# Patient Record
Sex: Female | Born: 1975 | Race: White | Hispanic: No
Health system: Southern US, Academic
[De-identification: ages and names within clinical notes are randomized; demographics above are authoritative.]

## PROBLEM LIST (undated history)

## (undated) DIAGNOSIS — I319 Disease of pericardium, unspecified: Secondary | ICD-10-CM

## (undated) HISTORY — PX: WRIST SURGERY: SHX841

## (undated) HISTORY — PX: HERNIA REPAIR: SHX51

---

## 2016-06-21 ENCOUNTER — Emergency Department: Payer: BLUE CROSS/BLUE SHIELD

## 2016-06-21 ENCOUNTER — Encounter: Payer: Self-pay | Admitting: Emergency Medicine

## 2016-06-21 DIAGNOSIS — R0789 Other chest pain: Secondary | ICD-10-CM | POA: Diagnosis not present

## 2016-06-21 DIAGNOSIS — R42 Dizziness and giddiness: Secondary | ICD-10-CM | POA: Diagnosis present

## 2016-06-21 DIAGNOSIS — R202 Paresthesia of skin: Secondary | ICD-10-CM | POA: Diagnosis not present

## 2016-06-21 LAB — CBC
HEMATOCRIT: 39.6 % (ref 35.0–47.0)
Hemoglobin: 13.5 g/dL (ref 12.0–16.0)
MCH: 27.6 pg (ref 26.0–34.0)
MCHC: 34 g/dL (ref 32.0–36.0)
MCV: 81.1 fL (ref 80.0–100.0)
Platelets: 217 10*3/uL (ref 150–440)
RBC: 4.88 MIL/uL (ref 3.80–5.20)
RDW: 13.1 % (ref 11.5–14.5)
WBC: 12.6 10*3/uL — AB (ref 3.6–11.0)

## 2016-06-21 LAB — BASIC METABOLIC PANEL
Anion gap: 9 (ref 5–15)
BUN: 14 mg/dL (ref 6–20)
CHLORIDE: 106 mmol/L (ref 101–111)
CO2: 23 mmol/L (ref 22–32)
Calcium: 8.9 mg/dL (ref 8.9–10.3)
Creatinine, Ser: 0.51 mg/dL (ref 0.44–1.00)
GFR calc Af Amer: >60 mL/min (ref >60)
GFR calc non Af Amer: >60 mL/min (ref >60)
GLUCOSE: 105 mg/dL — AB (ref 65–99)
POTASSIUM: 3.5 mmol/L (ref 3.5–5.1)
SODIUM: 138 mmol/L (ref 135–145)

## 2016-06-21 LAB — TROPONIN I: Troponin I: <0.03 ng/mL (ref <0.03)

## 2016-06-21 NOTE — ED Triage Notes (Signed)
Pt says last night she felt "off"; today c/o pain to the center of her chest; "pings"; tingling to the left side of her neck, down her left arm and left leg; pt says her mom passed away Friday and has been under a lot of stress; wasn't overly concerned when she was just having chest pain, but the tingling to the left side of her body has her concerned;

## 2016-06-22 ENCOUNTER — Emergency Department
Admission: EM | Admit: 2016-06-22 | Discharge: 2016-06-22 | Disposition: A | Discharge status: Home / Self Care | Payer: BLUE CROSS/BLUE SHIELD | Attending: Emergency Medicine | Admitting: Emergency Medicine

## 2016-06-22 DIAGNOSIS — R202 Paresthesia of skin: Secondary | ICD-10-CM

## 2016-06-22 DIAGNOSIS — R0789 Other chest pain: Secondary | ICD-10-CM

## 2016-06-22 HISTORY — DX: Disease of pericardium, unspecified: I31.9

## 2016-06-22 NOTE — ED Notes (Signed)
Pt states that she is in town and under a lot of stress right now because her mother just passed away yesterday. Pt states that she has been having some "twinges" in her chest and some numbness and tingling in the left side of her neck, left arm, and left leg, pt states that the tingling in her arm and leg have been going on for the past few weeks and states that she has been treated for a pinched nerve in her neck for the past few weeks as well. Pt states that she grew concerned when she started feeling the twinge in the left side of her neck and thought maybe she was having a heart attack. No distress noted at this time, pt tearful and asking for the testing to be expedited due to her mother's service in the morning

## 2016-06-22 NOTE — ED Provider Notes (Signed)
[facility] Emergency Department Provider Note  ____________________________________________  Time seen: Approximately 1:47 AM  I have reviewed the triage vital signs and the nursing notes.   HISTORY  Chief Complaint Chest Pain; Tingling; and Dizziness    HPI Melanie West is a 40 y.o. female who complains of tingling of her left hand left leg and left side of the neck for the past 24 hours. Gradual onset, constant. No headaches vision changes or trauma. Also has been having brief fleeting "pings" of pain in the chest and left neck as well. She does note that her mother died 2 days ago just prior to the onset of the symptoms. [place] just [place] here from [place] to be with her family and attend to service tomorrow. Denies any chest pain shortness of breath or abdominal pain. No medical history. No significant prior surgeries. No history of early CAD or strokes in her family.  Chest pain not exertional nor pleuritic.  No vomiting, sob, diaphoresis, or radiation.    Past Medical History:  Diagnosis Date  . Pericarditis      There are no active problems to display for this patient.    Past Surgical History:  Procedure Laterality Date  . CESAREAN SECTION    . HERNIA REPAIR    . WRIST SURGERY Right      Prior to Admission medications   Not on File     Allergies Review of patient's allergies indicates no known allergies.   History reviewed. No pertinent family history.  Social History Social History  Substance Use Topics  . Smoking status: Never Smoker  . Smokeless tobacco: Never Used  . Alcohol use No    Review of Systems  Constitutional:   No fever or chills. Anxious ENT:   No sore throat. No rhinorrhea. Cardiovascular:   Positive fleeting chest pain. Respiratory:   No dyspnea or cough. Gastrointestinal:   Negative for abdominal pain, vomiting and diarrhea.   Musculoskeletal:   Negative for focal pain or swelling Neurological:    Negative for headaches. Positive for left-sided paresthesias 10-point ROS otherwise negative.  ____________________________________________   PHYSICAL EXAM:  VITAL SIGNS: ED Triage Vitals  Enc Vitals Group     BP 06/21/16 2307 122/86     Pulse Rate 06/21/16 2307 73     Resp 06/21/16 2307 18     Temp 06/21/16 2307 98.6 F (37 C)     Temp Source 06/21/16 2307 Oral     SpO2 06/21/16 2307 95 %     Weight 06/21/16 2305 155 lb (70.3 kg)     Height 06/21/16 2305 5\' 6"  (1.676 m)     Head Circumference --      Peak Flow --      Pain Score 06/21/16 2305 4     Pain Loc --      Pain Edu? --      Excl. in GC? --     Vital signs reviewed, nursing assessments reviewed.   Constitutional:   Alert and oriented. Well appearing and in no distress.Anxious appearing and emotionally upset Eyes:   No scleral icterus. No conjunctival pallor. PERRL. EOMI.  No nystagmus. ENT   Head:   Normocephalic and atraumatic.   Nose:   No congestion/rhinnorhea. No septal hematoma   Mouth/Throat:   MMM, no pharyngeal erythema. No peritonsillar mass.    Neck:   No stridor. No SubQ emphysema. No meningismus. Hematological/Lymphatic/Immunilogical:   No cervical lymphadenopathy. Cardiovascular:   RRR. Symmetric bilateral  radial and DP pulses.  No murmurs.  Respiratory:   Normal respiratory effort without tachypnea nor retractions. Breath sounds are clear and equal bilaterally. No wheezes/rales/rhonchi. Gastrointestinal:   Soft and nontender. Non distended. There is no CVA tenderness.  No rebound, rigidity, or guarding. Genitourinary:   deferred Musculoskeletal:   Nontender with normal range of motion in all extremities. No joint effusions.  No lower extremity tenderness.  No edema. Neurologic:   Normal speech and language.  CN 2-10 normal. Normal gait, no pronator drift. Motor grossly intact. No gross focal neurologic deficits are appreciated.  Skin:    Skin is warm, dry and intact. No rash noted.   No petechiae, purpura, or bullae.  ____________________________________________    LABS (pertinent positives/negatives) (all labs ordered are listed, but only abnormal results are displayed) Labs Reviewed  BASIC METABOLIC PANEL - Abnormal; Notable for the following:       Result Value   Glucose, Bld 105 (*)    All other components within normal limits  CBC - Abnormal; Notable for the following:    WBC 12.6 (*)    All other components within normal limits  TROPONIN I   ____________________________________________   EKG  Interpreted by me Sinus rhythm rate of 79, normal axis intervals QRS ST segments and T waves  ____________________________________________    RADIOLOGY  CT head unremarkable Chest x-ray unremarkable  ____________________________________________   PROCEDURES Procedures  ____________________________________________   INITIAL IMPRESSION / ASSESSMENT AND PLAN / ED COURSE  Pertinent labs & imaging results that were available during my care of the patient were reviewed by me and considered in my medical decision making (see chart for details).  Patient well appearing no acute distress, presents with various paresthesias and fleeting anterior chest pain which is atypical and nonspecific, in the setting of grieving for her mother who has very recently died and the same timeframe as he symptoms started.Considering the patient's symptoms, medical history, and physical examination today, I have low suspicion for ACS, PE, TAD, pneumothorax, carditis, mediastinitis, pneumonia, CHF, or sepsis. I have low suspicion for ischemic stroke, intracranial hemorrhage, meningitis, encephalitis, carotid or vertebral dissection, venous sinus thrombosis, MS, intracranial hypertension, glaucoma, CRAO, CRVO, or temporal arteritis.  Patient will follow-up with her primary care doctor when she returns home to [place] in about a week.    Clinical Course    ____________________________________________   FINAL CLINICAL IMPRESSION(S) / ED DIAGNOSES  Final diagnoses:  Paresthesia       Portions of this note were generated with dragon dictation software. Dictation errors may occur despite best attempts at proofreading.    Sharman Cheek, MD 06/22/16 [phone]

## 2016-06-24 ENCOUNTER — Emergency Department: Payer: BLUE CROSS/BLUE SHIELD

## 2016-06-24 ENCOUNTER — Emergency Department
Admission: EM | Admit: 2016-06-24 | Discharge: 2016-06-24 | Discharge status: Against Medical Advice | Payer: BLUE CROSS/BLUE SHIELD | Attending: Emergency Medicine | Admitting: Emergency Medicine

## 2016-06-24 ENCOUNTER — Encounter: Payer: Self-pay | Admitting: Intensive Care

## 2016-06-24 DIAGNOSIS — R202 Paresthesia of skin: Secondary | ICD-10-CM

## 2016-06-24 DIAGNOSIS — F419 Anxiety disorder, unspecified: Secondary | ICD-10-CM | POA: Diagnosis not present

## 2016-06-24 LAB — BASIC METABOLIC PANEL
Anion gap: 8 (ref 5–15)
BUN: 12 mg/dL (ref 6–20)
CALCIUM: 9.3 mg/dL (ref 8.9–10.3)
CO2: 25 mmol/L (ref 22–32)
CREATININE: 0.66 mg/dL (ref 0.44–1.00)
Chloride: 107 mmol/L (ref 101–111)
GFR calc Af Amer: >60 mL/min (ref >60)
GFR calc non Af Amer: >60 mL/min (ref >60)
GLUCOSE: 120 mg/dL — AB (ref 65–99)
Potassium: 3.6 mmol/L (ref 3.5–5.1)
Sodium: 140 mmol/L (ref 135–145)

## 2016-06-24 LAB — CBC
HCT: 40.6 % (ref 35.0–47.0)
HEMOGLOBIN: 13.7 g/dL (ref 12.0–16.0)
MCH: 27.5 pg (ref 26.0–34.0)
MCHC: 33.9 g/dL (ref 32.0–36.0)
MCV: 81.1 fL (ref 80.0–100.0)
Platelets: 237 10*3/uL (ref 150–440)
RBC: 5 MIL/uL (ref 3.80–5.20)
RDW: 13.3 % (ref 11.5–14.5)
WBC: 10 10*3/uL (ref 3.6–11.0)

## 2016-06-24 LAB — TROPONIN I: Troponin I: <0.03 ng/mL (ref <0.03)

## 2016-06-24 MED ORDER — IOPAMIDOL (ISOVUE-370) INJECTION 76%
75.0000 mL | Freq: Once | INTRAVENOUS | Status: AC | PRN
  Administered 2016-06-24: 75 mL via INTRAVENOUS

## 2016-06-24 NOTE — ED Provider Notes (Signed)
[facility] Emergency Department Provider Note    ____________________________________________   I have reviewed the triage vital signs and the nursing notes.   HISTORY  Chief Complaint Numbness (left side facial numbness and tingling) and Anxiety   History limited by: Not Limited   HPI` Melanie West is a 40 y.o. female who presents to the emergency department today because of concerns for continued tingling to the left side of her body, left face and left neck pain. The patient was seen in the emergency department 2 days ago for these symptoms. At that time a CT scan of her head was done and blood work neither of which showed any obvious etiology. The patient is under a lot of stress having come to [place] for her mother's funeral. The mother services tonight. The patient states that the tingling and numbness has gotten worse. She also states that she's been having worsening left-sided neck pressure.   Past Medical History:  Diagnosis Date  . Pericarditis     There are no active problems to display for this patient.   Past Surgical History:  Procedure Laterality Date  . CESAREAN SECTION    . HERNIA REPAIR    . WRIST SURGERY Right     Prior to Admission medications   Not on File    Allergies Review of patient's allergies indicates no known allergies.  History reviewed. No pertinent family history.  Social History Social History  Substance Use Topics  . Smoking status: Never Smoker  . Smokeless tobacco: Never Used  . Alcohol use No    Review of Systems  Constitutional: Negative for fever. Cardiovascular: Negative for chest pain. Respiratory: Negative for shortness of breath. Gastrointestinal: Negative for abdominal pain, vomiting and diarrhea. Genitourinary: Negative for dysuria. Musculoskeletal: Negative for back pain. Skin: Negative for rash. Neurological: Positive for tingling to the left side of her body. Left side of the  face.  10-point ROS otherwise negative.  ____________________________________________   PHYSICAL EXAM:  VITAL SIGNS: ED Triage Vitals  Enc Vitals Group     BP 06/24/16 1341 133/69     Pulse Rate 06/24/16 1341 75     Resp 06/24/16 1341 17     Temp 06/24/16 1341 98.4 F (36.9 C)     Temp Source 06/24/16 1341 Oral     SpO2 06/24/16 1341 96 %     Weight 06/24/16 1342 155 lb (70.3 kg)     Height 06/24/16 1342 5\' 6"  (1.676 m)     Head Circumference --      Peak Flow --      Pain Score 06/24/16 1347 2   Constitutional: Alert and oriented. Well appearing and in no distress. Eyes: Conjunctivae are normal. Normal extraocular movements. ENT   Head: Normocephalic and atraumatic.   Nose: No congestion/rhinnorhea.   Mouth/Throat: Mucous membranes are moist.   Neck: No stridor. Hematological/Lymphatic/Immunilogical: No cervical lymphadenopathy. Cardiovascular: Normal rate, regular rhythm.  No murmurs, rubs, or gallops. Respiratory: Normal respiratory effort without tachypnea nor retractions. Breath sounds are clear and equal bilaterally. No wheezes/rales/rhonchi. Gastrointestinal: Soft and nontender. No distention.  Genitourinary: Deferred Musculoskeletal: Normal range of motion in all extremities. No lower extremity edema. Neurologic:  Normal speech and language. No gross focal neurologic deficits are appreciated.  Skin:  Skin is warm, dry and intact. No rash noted. Psychiatric: Mood and affect are normal. Speech and behavior are normal. Patient exhibits appropriate insight and judgment.  ____________________________________________    LABS (pertinent positives/negatives)  Labs  Reviewed  BASIC METABOLIC PANEL - Abnormal; Notable for the following:       Result Value   Glucose, Bld 120 (*)    All other components within normal limits  CBC  TROPONIN I  URINALYSIS COMPLETEWITH MICROSCOPIC ([facility] ONLY)    ____________________________________________   EKG  I,  Phineas SemenGraydon Rafeal Skibicki, attending physician, personally viewed and interpreted this EKG  EKG Time: 1349 Rate: 76 Rhythm: normal sinus rhythm with PVC Axis: normal Intervals: qtc 438 QRS: narrow ST changes: no st elevation Impression: normal ekg   ____________________________________________    RADIOLOGY  CTA neck, head IMPRESSION:  1. Normal variant CTA circle of Willis without significant proximal  stenosis, aneurysm, or branch vessel occlusion.  2. No significant vascular disease in the neck.  3. Normal CT appearance of the brain following contrast.  4. Mild spondylosis of the cervical spine as described.     ____________________________________________   PROCEDURES  Procedures  ____________________________________________   INITIAL IMPRESSION / ASSESSMENT AND PLAN / ED COURSE  Pertinent labs & imaging results that were available during my care of the patient were reviewed by me and considered in my medical decision making (see chart for details).  Patient presented to the emergency department today with continued and worsening left-sided tingling. The patient also complained of left neck discomfort. CTA of the head and neck were ordered. The patient left the emergency department prior to the results however and following up the results were negative for any concerning lesions. ____________________________________________   FINAL CLINICAL IMPRESSION(S) / ED DIAGNOSES  Parasthesias  Note: This dictation was prepared with Dragon dictation. Any transcriptional errors that result from this process are unintentional    Phineas SemenGraydon Latana Colin, MD 06/24/16 2020

## 2016-06-24 NOTE — ED Triage Notes (Addendum)
PAtient able to ambulate with NAD noted. Bilateral grips, strong and equal. No facial droop noted. Speech clear

## 2016-06-24 NOTE — ED Notes (Signed)
Pt refused wheelchair and ambulated from triage back to lobby

## 2016-06-24 NOTE — ED Notes (Signed)
Patient no longer in room. Per staff "patient left". This RN did not get discharge vitals and/or signature.

## 2016-06-24 NOTE — ED Triage Notes (Signed)
Pt returns today with left sided facial numbness and tingling with  Left sided neck pressure. Pt states she was seen here for same two days ago for facial numbness but is worse and now neck pressure is a new symptom today. Pt states she is under a lot of stress with her mother just passing and the funeral service is tonight.

## 2017-01-27 ENCOUNTER — Emergency Department: Payer: BLUE CROSS/BLUE SHIELD

## 2017-01-27 ENCOUNTER — Emergency Department
Admission: EM | Admit: 2017-01-27 | Discharge: 2017-01-27 | Disposition: A | Discharge status: Home / Self Care | Payer: BLUE CROSS/BLUE SHIELD | Attending: Emergency Medicine | Admitting: Emergency Medicine

## 2017-01-27 DIAGNOSIS — R002 Palpitations: Secondary | ICD-10-CM | POA: Insufficient documentation

## 2017-01-27 DIAGNOSIS — R079 Chest pain, unspecified: Secondary | ICD-10-CM

## 2017-01-27 DIAGNOSIS — R0789 Other chest pain: Secondary | ICD-10-CM | POA: Insufficient documentation

## 2017-01-27 LAB — CBC
HCT: 41.6 % (ref 35.0–47.0)
HEMOGLOBIN: 14 g/dL (ref 12.0–16.0)
MCH: 27.2 pg (ref 26.0–34.0)
MCHC: 33.5 g/dL (ref 32.0–36.0)
MCV: 81.1 fL (ref 80.0–100.0)
Platelets: 231 10*3/uL (ref 150–440)
RBC: 5.13 MIL/uL (ref 3.80–5.20)
RDW: 13.3 % (ref 11.5–14.5)
WBC: 10.9 10*3/uL (ref 3.6–11.0)

## 2017-01-27 LAB — BASIC METABOLIC PANEL
Anion gap: 7 (ref 5–15)
BUN: 12 mg/dL (ref 6–20)
CHLORIDE: 105 mmol/L (ref 101–111)
CO2: 25 mmol/L (ref 22–32)
CREATININE: 0.65 mg/dL (ref 0.44–1.00)
Calcium: 9.1 mg/dL (ref 8.9–10.3)
GFR calc Af Amer: >60 mL/min (ref >60)
GFR calc non Af Amer: >60 mL/min (ref >60)
GLUCOSE: 116 mg/dL — AB (ref 65–99)
Potassium: 3.6 mmol/L (ref 3.5–5.1)
SODIUM: 137 mmol/L (ref 135–145)

## 2017-01-27 LAB — TROPONIN I
Troponin I: <0.03 ng/mL (ref <0.03)
Troponin I: <0.03 ng/mL (ref <0.03)

## 2017-01-27 LAB — FIBRIN DERIVATIVES D-DIMER (ARMC ONLY): Fibrin derivatives D-dimer (ARMC): 217.41 (ref 0.00–499.00)

## 2017-01-27 NOTE — ED Triage Notes (Signed)
Pt in with co chest pain and palpitations since yest, states tonight became severe. Had internal monitor placed in December to collect possible rhythm irregularities with none noted thus far.

## 2017-01-27 NOTE — ED Provider Notes (Signed)
[facility] Emergency Department Provider Note    First MD Initiated Contact with Patient 01/27/17 [phone]     (approximate)  I have reviewed the triage vital signs and the nursing notes.   HISTORY  Chief Complaint Chest Pain   HPI Melanie West is a 41 y.o. female with history of "heart palpitations currently with a loop recorder presents to emergency department with acute onset of brief episode of palpitation this morning on awakening. Patient admits to left chest wall/arm discomfort which she states that she has had intermittently for a while. Patient states that she was advised that the discomfort was secondary to neck surgery was performed. Patient denies any pain at present no shortness of breath no nausea or vomiting or diaphoresis. She denies any palpitations at present. Patient states that she was notified and she had premature ventricular contractions that were noted secondary to the indwelling loop recorder.   Past Medical History:  Diagnosis Date  . Pericarditis     There are no active problems to display for this patient.   Past Surgical History:  Procedure Laterality Date  . CESAREAN SECTION    . HERNIA REPAIR    . WRIST SURGERY Right     Prior to Admission medications   Not on File    Allergies No Known drug allergies  No family history on file.  Social History Social History  Substance Use Topics  . Smoking status: Never Smoker  . Smokeless tobacco: Never Used  . Alcohol use No    Review of Systems Constitutional: No fever/chills Eyes: No visual changes. ENT: No sore throat. Cardiovascular: Denies chest pain. Respiratory: Denies shortness of breath. Gastrointestinal: No abdominal pain.  No nausea, no vomiting.  No diarrhea.  No constipation. Genitourinary: Negative for dysuria. Musculoskeletal: Negative for back pain. Skin: Negative for rash. Neurological: Negative for headaches, focal weakness or numbness.  10-point  ROS otherwise negative.  ____________________________________________   PHYSICAL EXAM:  VITAL SIGNS: ED Triage Vitals [01/27/17 0328]  Enc Vitals Group     BP 123/72     Pulse Rate (!) 59     Resp 18     Temp 98.4 F (36.9 C)     Temp Source Oral     SpO2 95 %     Weight 155 lb (70.3 kg)     Height  (1.676 m)     Head Circumference      Peak Flow      Pain Score 4     Pain Loc      Pain Edu?      Excl. in GC?     Constitutional: Alert and oriented. Well appearing and in no acute distress. Eyes: Conjunctivae are normal. PERRL. EOMI. Head: Atraumatic. Mouth/Throat: Mucous membranes are moist.  Oropharynx non-erythematous. Neck: No stridor.  Cardiovascular: Normal rate, regular rhythm. Good peripheral circulation. Grossly normal heart sounds. Respiratory: Normal respiratory effort.  No retractions. Lungs CTAB. Gastrointestinal: Soft and nontender. No distention.  Musculoskeletal: No lower extremity tenderness nor edema. No gross deformities of extremities. Neurologic:  Normal speech and language. No gross focal neurologic deficits are appreciated.  Skin:  Skin is warm, dry and intact. No rash noted. Psychiatric: Mood and affect are normal. Speech and behavior are normal.  ____________________________________________   LABS (all labs ordered are listed, but only abnormal results are displayed)  Labs Reviewed  BASIC METABOLIC PANEL - Abnormal; Notable for the following:       Result Value  Glucose, Bld 116 (*)    All other components within normal limits  CBC  TROPONIN I  TROPONIN I  FIBRIN DERIVATIVES D-DIMER ([facility] ONLY)   ____________________________________________  EKG  ED ECG REPORT I, [place] N BROWN, the attending physician, personally viewed and interpreted this ECG.   Date: 01/28/2017  EKG Time: 3:26 AM  Rate: 56  Rhythm: Sinus bradycardia  Axis: Normal  Intervals: Normal  ST&T Change:  None  ____________________________________________  RADIOLOGY I, [place] N BROWN, personally viewed and evaluated these images (plain radiographs) as part of my medical decision making, as well as reviewing the written report by the radiologist.  Dg Chest 2 View  Result Date: 01/27/2017 CLINICAL DATA:  Chest pain and palpitations since yesterday, the coming severe tonight. EXAM: CHEST  2 VIEW COMPARISON:  06/21/2016 FINDINGS: A loop recorder superimposes the left hemithorax. The lungs are clear. Heart size is normal. Pulmonary vasculature is normal. Hilar and mediastinal contours are normal. There is no pleural effusion. IMPRESSION: No active cardiopulmonary disease. Electronically Signed   By: Ellery Plunk M.D.   On: 01/27/2017 04:22     Procedures   ____________________________________________   INITIAL IMPRESSION / ASSESSMENT AND PLAN / ED COURSE  Pertinent labs & imaging results that were available during my care of the patient were reviewed by me and considered in my medical decision making (see chart for details).  41 year old female presenting to the emergency department with brief episode "lasting seconds" of heart palpitations with spontaneous resolution in addition to left chest wall discomfort. EKG revealed no evidence of arrhythmia no ST segment elevation or depression. Cardiac enzymes negative 2. Patient advised to follow-up with cardiologist as planned in [place].      ____________________________________________  FINAL CLINICAL IMPRESSION(S) / ED DIAGNOSES  Final diagnoses:  Nonspecific chest pain  Palpitations     MEDICATIONS GIVEN DURING THIS VISIT:  Medications - No data to display   NEW OUTPATIENT MEDICATIONS STARTED DURING THIS VISIT:  New Prescriptions   No medications on file    Modified Medications   No medications on file    Discontinued Medications   No medications on file     Note:  This document was prepared using Dragon  voice recognition software and may include unintentional dictation errors.    Darci Current, MD 01/28/17 [phone]

## 2017-11-13 IMAGING — CT CT ANGIO NECK
1 of 8 series · 6 of 33 positions shown · IV contrast (APPLIED)
Comparison: None.

CLINICAL DATA: Recurrent left-sided facial numbness and tingling.
Left-sided neck pressure is new.

EXAM:
CT ANGIOGRAPHY HEAD AND NECK
TECHNIQUE: Multidetector CT imaging of the head and neck was performed using
the standard protocol during bolus administration of intravenous
contrast. Multiplanar CT image reconstructions and MIPs were
obtained to evaluate the vascular anatomy. Carotid stenosis
measurements (when applicable) are obtained utilizing NASCET
criteria, using the distal internal carotid diameter as the
denominator.
CONTRAST:  75 mL Isovue 370.

[Series 6: ax thin · axial · 0.48mm/px · z∈[-122,+148]mm · 6 of 378 slices shown]
[im 54/378  soft-tissue]
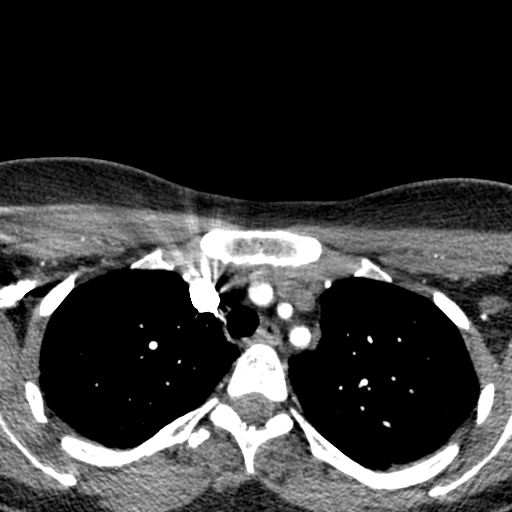
[im 108/378  bone]
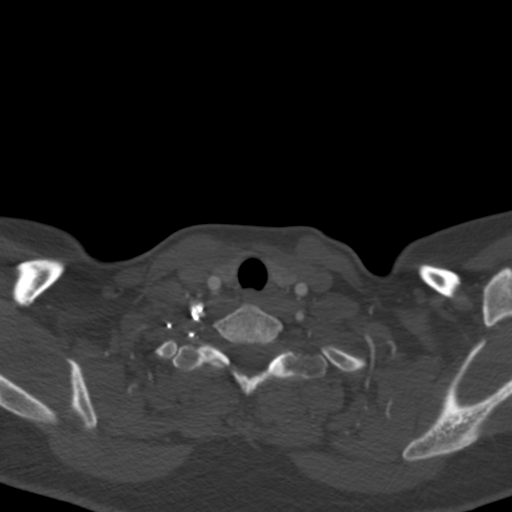
[im 162/378  soft-tissue]
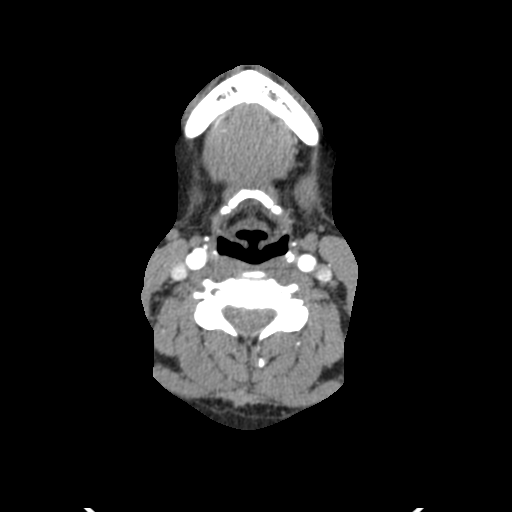
[im 216/378  bone]
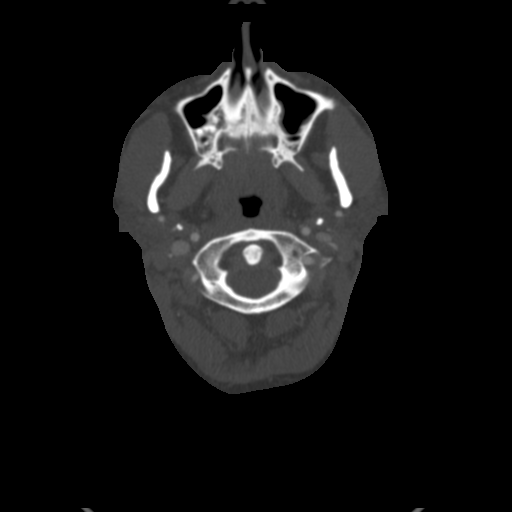
[im 270/378  soft-tissue]
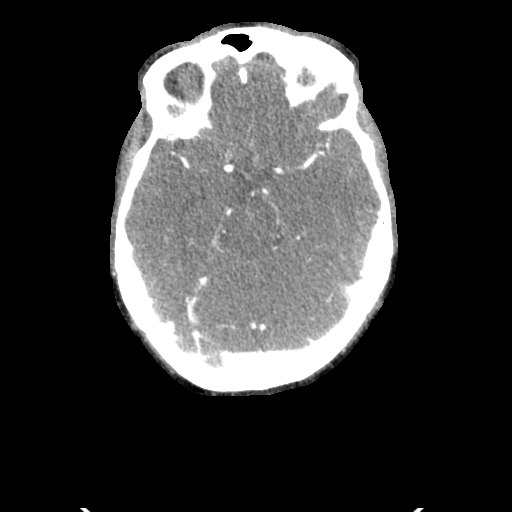
[im 324/378  bone]
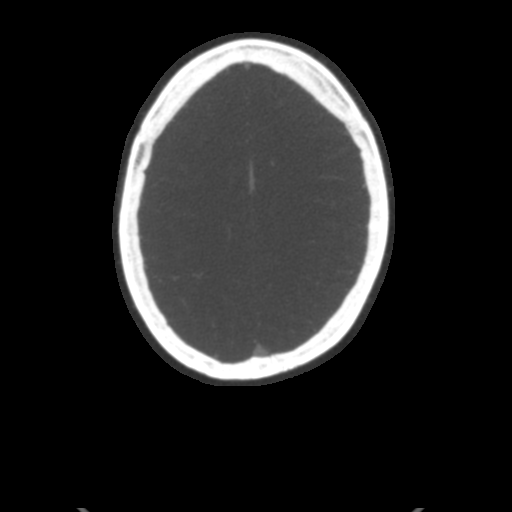

[6 of 33 positions shown; findings below may reference images not displayed]

FINDINGS: CT HEAD

Brain: No acute infarct, hemorrhage, or mass lesion is present.
There is no significant white matter disease. Basal ganglia and
insular cortex are intact.

Calvarium and skull base: Within normal limits.

Paranasal sinuses: Clear

Orbits: Within normal limits

CTA NECK

Aortic arch: A 3 vessel arch configuration is present. No
significant atherosclerotic calcifications are present at the aortic
arch. There is no significant stenosis of the great vessel origins.

Right carotid system: The right common carotid artery is within
normal limits. The bifurcation is unremarkable. Cervical right ICA
is normal.

Left carotid system: The left common carotid artery is within normal
limits. The carotid bifurcation is unremarkable. Cervical left ICA
is normal.

Vertebral arteries:The vertebral arteries both originate from the
subclavian arteries. The left vertebral artery origin is obscured by
adjacent venous contrast. The left vertebral artery is the dominant
vessel. The vertebral arteries are otherwise within normal limits
throughout the neck.

Skeleton: Asymmetric right-sided uncovertebral spurring and mild
osseous foraminal narrowing is present on the right at C4-5. There
is mild osseous narrowing at C3-4 on the right as well. Left-sided
disease is more mild. Slight rightward curvature is present in the
lower cervical spine. No focal lytic or blastic lesions are present.

Other neck: Soft tissues the neck are otherwise unremarkable.
Thyroid is within normal limits. Salivary glands are within normal
limits. No significant adenopathy is present.

Upper chest: The lung apices are clear. The superior mediastinum is
within normal limits.

CTA HEAD

Anterior circulation: The internal carotid arteries are within
normal limits from the skullbase to the ICA terminus bilaterally. No
significant atherosclerotic calcifications or stenoses are present.
The A1 and M1 segments are intact. The anterior communicating artery
is patent. The MCA bifurcations are within normal limits. ACA and
MCA branch vessels are unremarkable.

Posterior circulation: The left vertebral artery is dominant. The
vertebrobasilar junction is intact. The right posterior cerebral
artery is of fetal type. The left posterior cerebral artery
originates from basilar tip. PCA branch vessels are within normal
limits.

Venous sinuses: The dural sinuses are patent. The straight sinus and
deep cerebral veins are intact. The right transverse sinus is
dominant. Cortical veins are unremarkable.

Anatomic variants: Fetal type right posterior cerebral artery.

Delayed phase: The postcontrast images demonstrate no pathologic
enhancement.
IMPRESSION: 1. Normal variant CTA circle of Willis without significant proximal
stenosis, aneurysm, or branch vessel occlusion.
2. No significant vascular disease in the neck.
3. Normal CT appearance of the brain following contrast.
4. Mild spondylosis of the cervical spine as described.

## 2018-06-18 IMAGING — CR DG CHEST 2V
1 series · 2 of 2 positions shown · non-contrast
Comparison: 06/21/2016

CLINICAL DATA: Chest pain and palpitations since yesterday, the
coming severe tonight.

EXAM:
CHEST  2 VIEW

[Series 1: dg chest 2 view · 0.14mm/px · 2 of 2 slices shown]
[im 1/2]
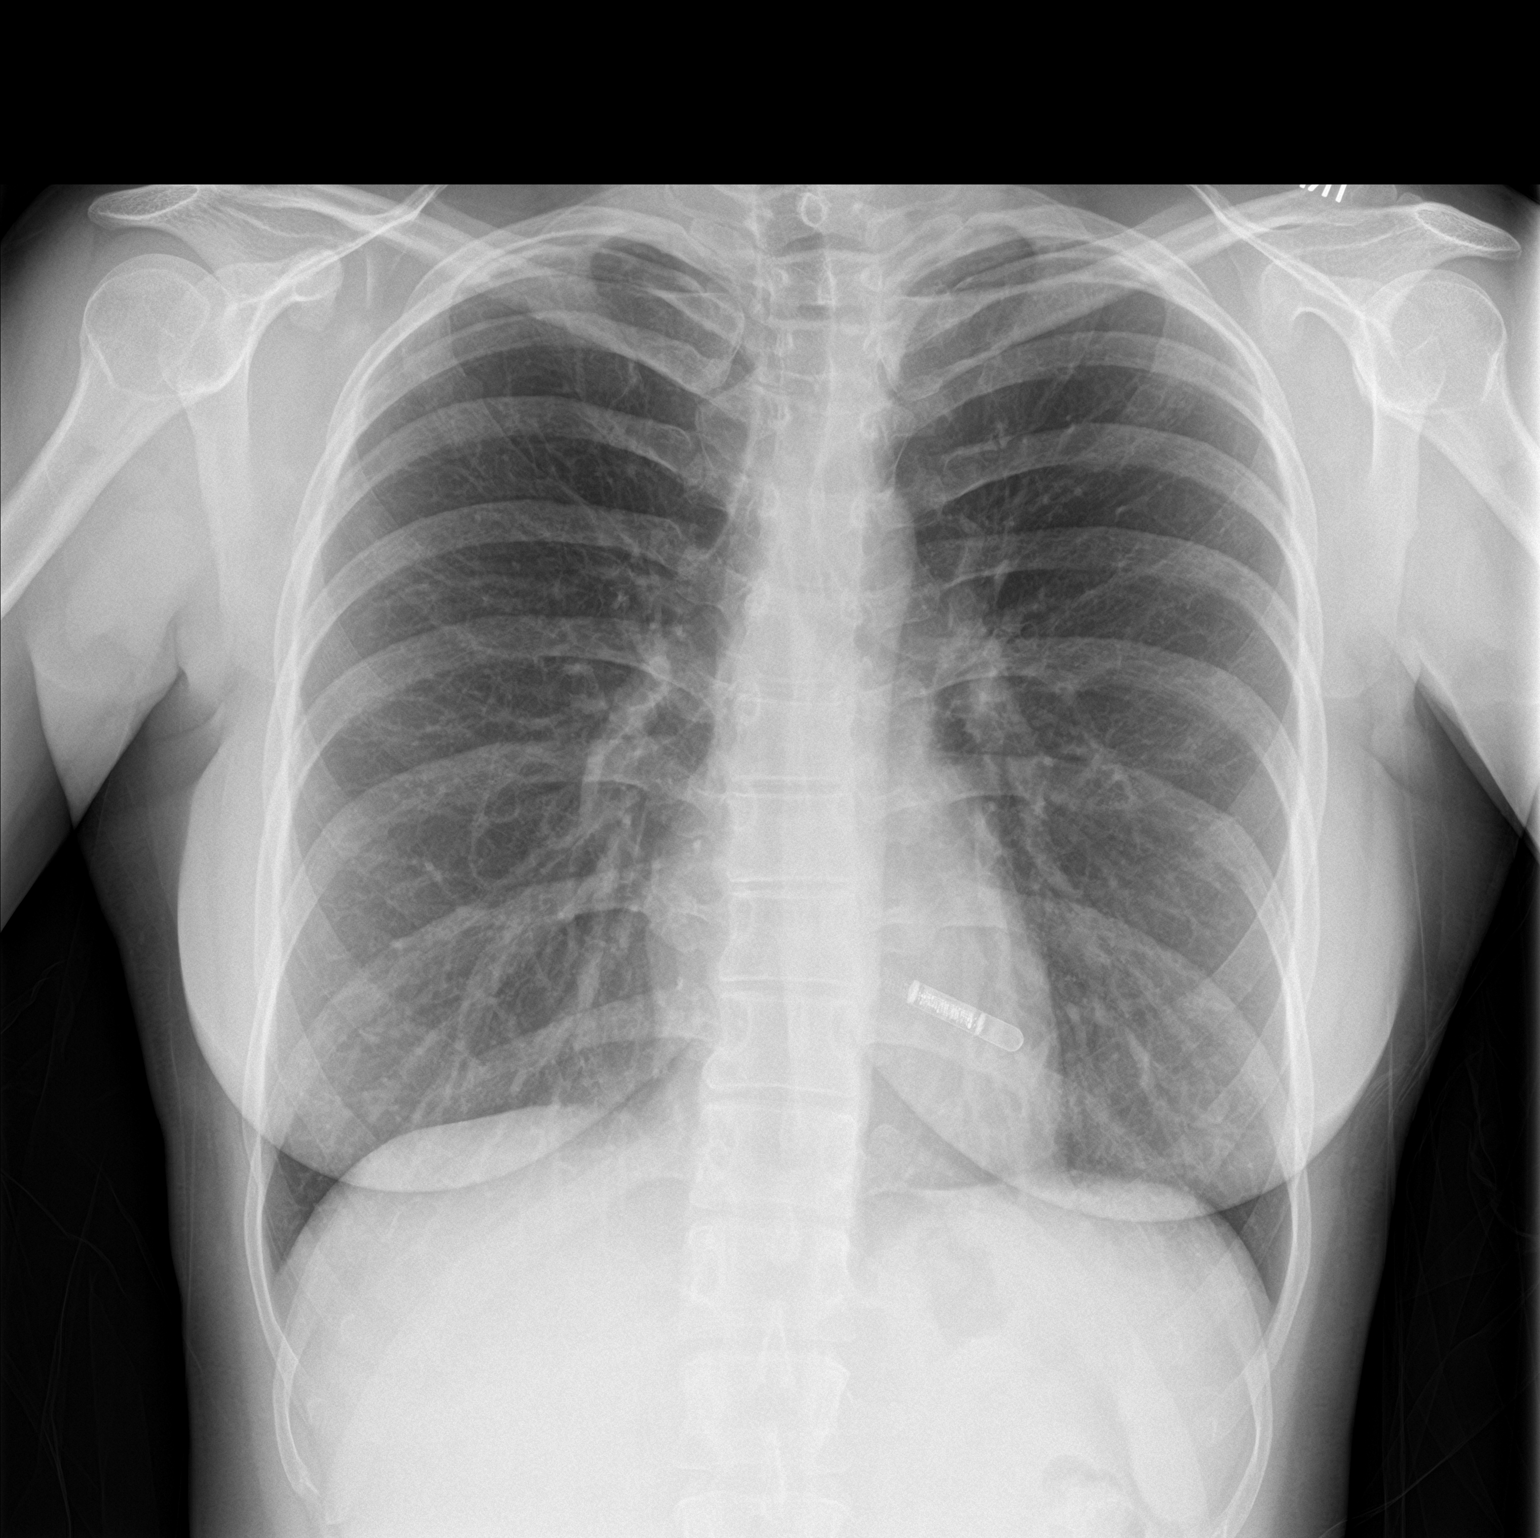
[im 2/2]
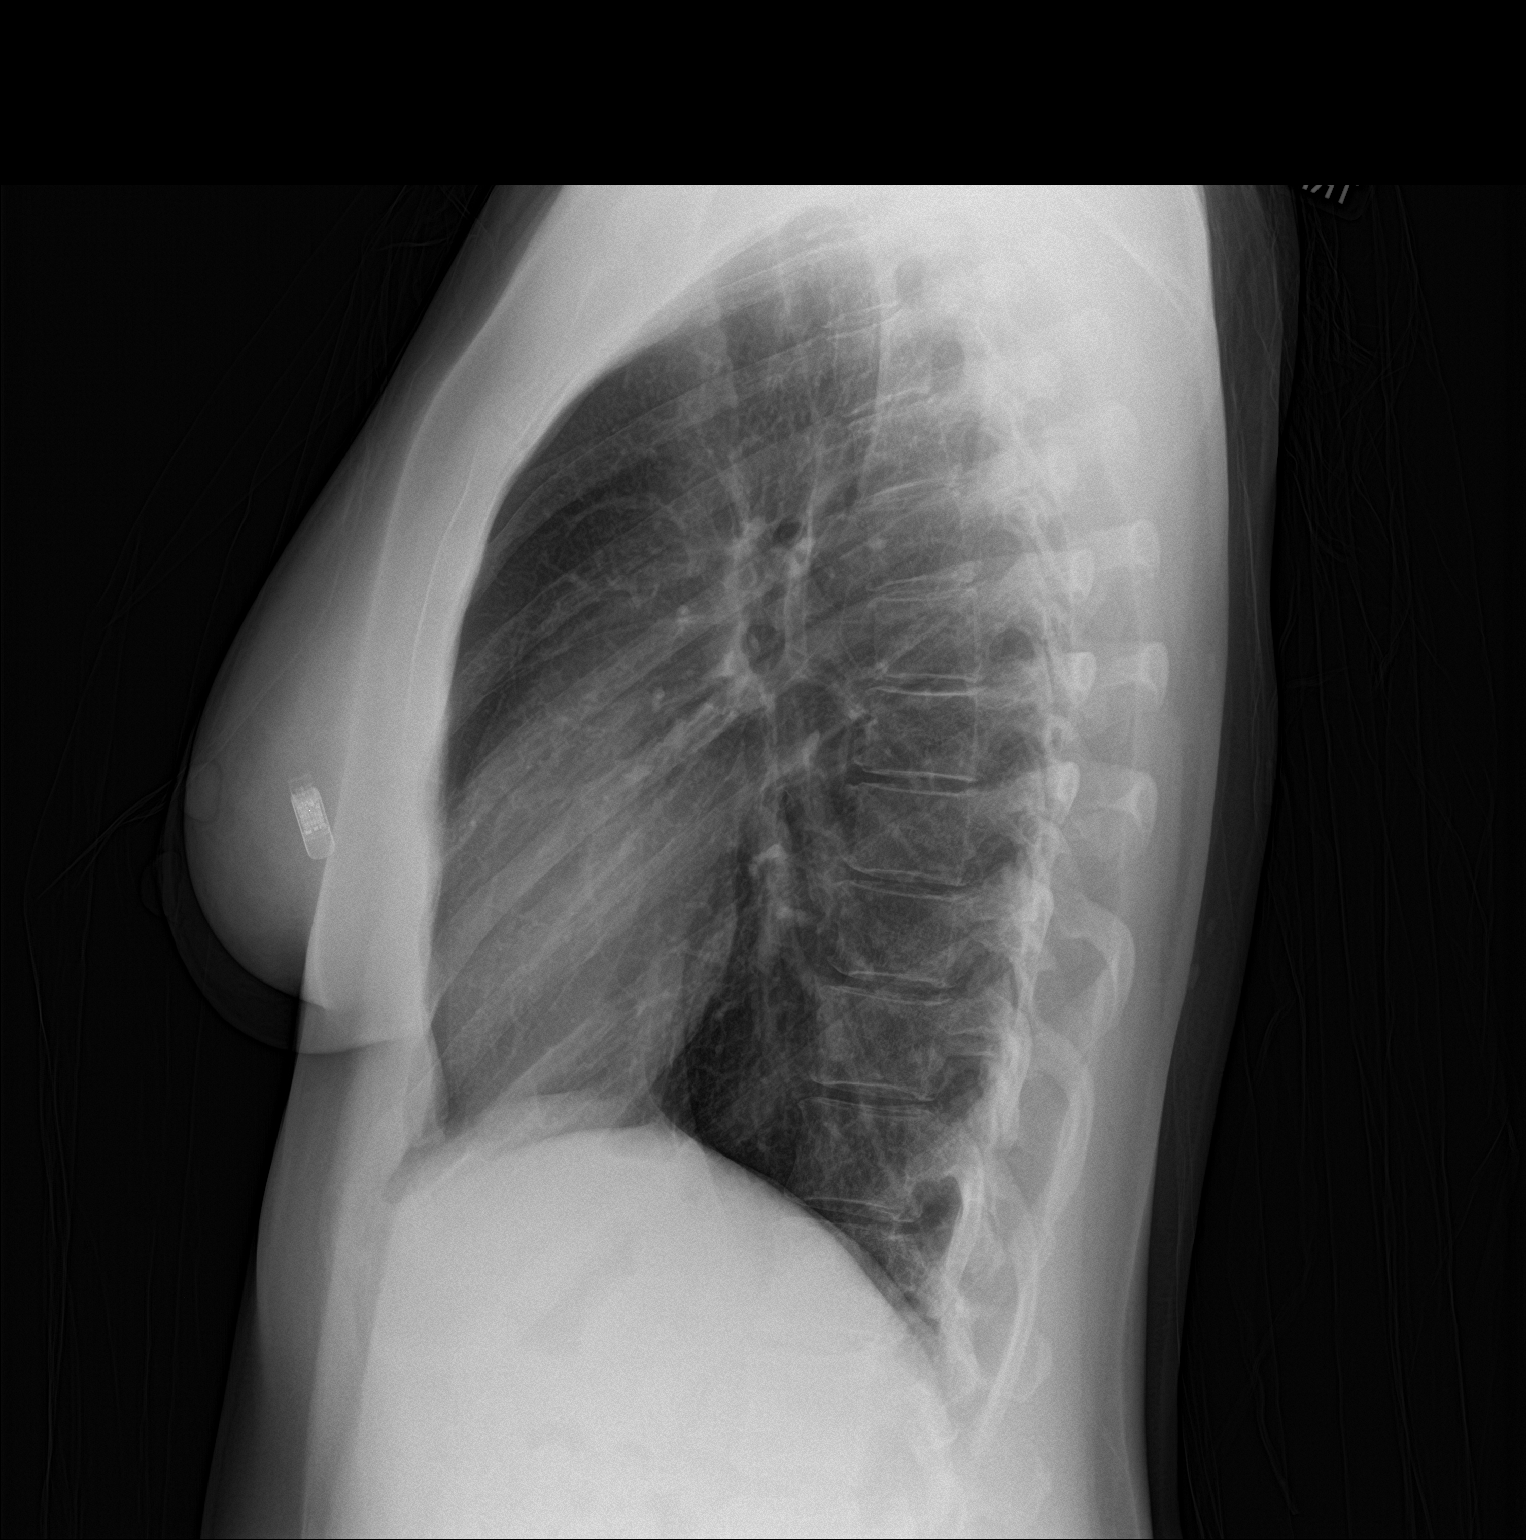

[2 of 2 positions shown; findings below may reference images not displayed]

FINDINGS: A loop recorder superimposes the left hemithorax.

The lungs are clear. Heart size is normal. Pulmonary vasculature is
normal. Hilar and mediastinal contours are normal. There is no
pleural effusion.
IMPRESSION: No active cardiopulmonary disease.

## 2022-04-14 ENCOUNTER — Emergency Department: Payer: BLUE CROSS/BLUE SHIELD

## 2022-04-14 ENCOUNTER — Other Ambulatory Visit: Payer: Self-pay

## 2022-04-14 ENCOUNTER — Emergency Department
Admission: EM | Admit: 2022-04-14 | Discharge: 2022-04-14 | Disposition: A | Discharge status: Home / Self Care | Payer: BLUE CROSS/BLUE SHIELD | Attending: Emergency Medicine | Admitting: Emergency Medicine

## 2022-04-14 ENCOUNTER — Encounter: Payer: Self-pay | Admitting: *Deleted

## 2022-04-14 DIAGNOSIS — M79652 Pain in left thigh: Secondary | ICD-10-CM | POA: Diagnosis not present

## 2022-04-14 DIAGNOSIS — M79662 Pain in left lower leg: Secondary | ICD-10-CM | POA: Insufficient documentation

## 2022-04-14 DIAGNOSIS — M79605 Pain in left leg: Secondary | ICD-10-CM

## 2022-04-14 LAB — BASIC METABOLIC PANEL
Anion gap: 9 (ref 5–15)
BUN: 14 mg/dL (ref 6–20)
CO2: 24 mmol/L (ref 22–32)
Calcium: 9.7 mg/dL (ref 8.9–10.3)
Chloride: 107 mmol/L (ref 98–111)
Creatinine, Ser: 0.67 mg/dL (ref 0.44–1.00)
GFR, Estimated: >60 mL/min (ref >60)
Glucose, Bld: 138 mg/dL — ABNORMAL HIGH (ref 70–99)
Potassium: 3.7 mmol/L (ref 3.5–5.1)
Sodium: 140 mmol/L (ref 135–145)

## 2022-04-14 LAB — CBC
HCT: 43 % (ref 36.0–46.0)
Hemoglobin: 13.7 g/dL (ref 12.0–15.0)
MCH: 27.1 pg (ref 26.0–34.0)
MCHC: 31.9 g/dL (ref 30.0–36.0)
MCV: 85.1 fL (ref 80.0–100.0)
Platelets: 237 10*3/uL (ref 150–400)
RBC: 5.05 MIL/uL (ref 3.87–5.11)
RDW: 12.9 % (ref 11.5–15.5)
WBC: 9.8 10*3/uL (ref 4.0–10.5)
nRBC: 0 % (ref 0.0–0.2)

## 2022-04-14 MED ORDER — METHOCARBAMOL 500 MG PO TABS: 500.0000 mg | ORAL_TABLET | Freq: Three times a day (TID) | ORAL | 0 refills | Status: AC | PRN

## 2022-04-14 MED ORDER — MELOXICAM 7.5 MG PO TABS
7.5000 mg | ORAL_TABLET | Freq: Once | ORAL | Status: AC
  Administered 2022-04-14: 7.5 mg via ORAL
  Filled 2022-04-14: qty 1

## 2022-04-14 MED ORDER — MELOXICAM 7.5 MG PO TABS: 7.5000 mg | ORAL_TABLET | Freq: Every day | ORAL | 0 refills | Status: AC

## 2022-04-14 NOTE — ED Provider Notes (Signed)
[facility] Provider Note  Patient Contact: 11:23 PM (approximate)   History   Leg Pain   HPI  Melanie West is a 46 y.o. female presents to the emergency department with pain along the posterior left thigh, left lateral calf that radiates into her foot.  Patient states that she has had a history of recent travel and her primary care provider advised her to rule out DVT.  She denies history of DVT or daily smoking.  Patient states that she does have a history of lumbar radiculopathy.  No bowel or bladder incontinence or saddle anesthesia.     Physical Exam   Triage Vital Signs: ED Triage Vitals  Enc Vitals Group     BP 04/14/22 2051 (!) 144/84     Pulse Rate 04/14/22 2051 64     Resp 04/14/22 2051 16     Temp 04/14/22 2051 98.6 F (37 C)     Temp Source 04/14/22 2051 Oral     SpO2 04/14/22 2051 97 %     Weight 04/14/22 2049 155 lb (70.3 kg)     Height 04/14/22 2049 5\' 5"  (1.651 m)     Head Circumference --      Peak Flow --      Pain Score 04/14/22 2058 6     Pain Loc --      Pain Edu? --      Excl. in [place]? --     Most recent vital signs: Vitals:   04/14/22 2051  BP: (!) 144/84  Pulse: 64  Resp: 16  Temp: 98.6 F (37 C)  SpO2: 97%     General: Alert and in no acute distress. Eyes:  PERRL. EOMI. Head: No acute traumatic findings ENT:      Nose: No congestion/rhinnorhea.      Mouth/Throat: Mucous membranes are moist. Neck: No stridor. No cervical spine tenderness to palpation. Cardiovascular:  Good peripheral perfusion Respiratory: Normal respiratory effort without tachypnea or retractions. Lungs CTAB. Good air entry to the bases with no decreased or absent breath sounds. Gastrointestinal: Bowel sounds 4 quadrants. Soft and nontender to palpation. No guarding or rigidity. No palpable masses. No distention. No CVA tenderness. Musculoskeletal: Full range of motion to all extremities.  Patient has symmetric strength in  plantarflexion. Neurologic:  No gross focal neurologic deficits are appreciated.  Pain is in distribution of S1 dermatome. Skin:   No rash noted Other:   ED Results / Procedures / Treatments   Labs (all labs ordered are listed, but only abnormal results are displayed) Labs Reviewed  BASIC METABOLIC PANEL - Abnormal; Notable for the following components:      Result Value   Glucose, Bld 138 (*)    All other components within normal limits  CBC  POC URINE PREG, ED        RADIOLOGY  I personally viewed and evaluated these images as part of my medical decision making, as well as reviewing the written report by the radiologist.  ED Provider Interpretation: Ultrasound shows no signs of DVT.   PROCEDURES:  Critical Care performed: No  Procedures   MEDICATIONS ORDERED IN ED: Medications  meloxicam (MOBIC) tablet 7.5 mg (7.5 mg Oral Given 04/14/22 2321)     IMPRESSION / MDM / ASSESSMENT AND PLAN / ED COURSE  I reviewed the triage vital signs and the nursing notes.  Assessment and plan Leg pain 46 year old female presents to the emergency department with leg pain and concern for DVT.  Vital signs were reassuring at triage.  Venous ultrasound showed no signs of DVT.  Distribution of patient's pain was in the S1 dermatome and I am concerned for nerve impingement.  Patient has symmetric strength in plantarflexion and has no difficulty standing or walking.  She states that she has had difficulty tolerating prednisone in the past.  We will start patient on low-dose meloxicam.  Patient did request a muscle relaxer to use sparingly at night before bed.  Return precautions were given to return with new or worsening symptoms.      FINAL CLINICAL IMPRESSION(S) / ED DIAGNOSES   Final diagnoses:  Pain of left lower extremity     Rx / DC Orders   ED Discharge Orders          Ordered    meloxicam (MOBIC) 7.5 MG tablet  Daily        04/14/22  2312    methocarbamol (ROBAXIN) 500 MG tablet  Every 8 hours PRN        04/14/22 2315             Note:  This document was prepared using Dragon voice recognition software and may include unintentional dictation errors.   Pia Mau Waelder, PA-C 04/14/22 2326    Minna Antis, MD 04/17/22 [phone]

## 2022-04-14 NOTE — ED Triage Notes (Signed)
Pt has pain in left lower leg.  Pt was on an airplane 4 days ago.  No sob.  No chest pain   pt alert  speech clear.

## 2022-04-14 NOTE — Discharge Instructions (Signed)
You can take 15 mg for 2 days.  After 2 days, please take 7.5 mg once daily.  You can take Robaxin at night before bed.

## 2022-12-30 ENCOUNTER — Emergency Department: Payer: BLUE CROSS/BLUE SHIELD

## 2022-12-30 ENCOUNTER — Other Ambulatory Visit: Payer: Self-pay

## 2022-12-30 DIAGNOSIS — R079 Chest pain, unspecified: Secondary | ICD-10-CM | POA: Diagnosis present

## 2022-12-30 DIAGNOSIS — Z20822 Contact with and (suspected) exposure to covid-19: Secondary | ICD-10-CM | POA: Diagnosis not present

## 2022-12-30 LAB — BASIC METABOLIC PANEL
Anion gap: 7 (ref 5–15)
BUN: 11 mg/dL (ref 6–20)
CO2: 23 mmol/L (ref 22–32)
Calcium: 9.3 mg/dL (ref 8.9–10.3)
Chloride: 108 mmol/L (ref 98–111)
Creatinine, Ser: 0.64 mg/dL (ref 0.44–1.00)
GFR, Estimated: >60 mL/min (ref >60)
Glucose, Bld: 135 mg/dL — ABNORMAL HIGH (ref 70–99)
Potassium: 3.4 mmol/L — ABNORMAL LOW (ref 3.5–5.1)
Sodium: 138 mmol/L (ref 135–145)

## 2022-12-30 LAB — CBC
HCT: 41.1 % (ref 36.0–46.0)
Hemoglobin: 13.2 g/dL (ref 12.0–15.0)
MCH: 27.2 pg (ref 26.0–34.0)
MCHC: 32.1 g/dL (ref 30.0–36.0)
MCV: 84.7 fL (ref 80.0–100.0)
Platelets: 256 10*3/uL (ref 150–400)
RBC: 4.85 MIL/uL (ref 3.87–5.11)
RDW: 13.9 % (ref 11.5–15.5)
WBC: 9.2 10*3/uL (ref 4.0–10.5)
nRBC: 0 % (ref 0.0–0.2)

## 2022-12-30 LAB — TROPONIN I (HIGH SENSITIVITY)
Troponin I (High Sensitivity): <2 ng/L (ref <18)
Troponin I (High Sensitivity): 2 ng/L (ref <18)

## 2022-12-30 NOTE — ED Triage Notes (Signed)
Reports chest "felt funny" yesterday with a thumping in her chest and HTN as well as indigestion. Reports continued today. Pt does state she has a loop recorder placed. States when she called the cardiologist the loop recorder did not show anything. Pt states she is in town from [place] due to her father dying and is thinking it may be stress related. Pt reports hx of tachycardia and arrhythmia which is what lead to loop recorder being placed. Reports pain is in central chest without radiation to neck, shoulder, arm, or jaw. Pt also endorses feeling congested with cough. Pt alert and oriented in triage. Breathing unlabored speaking in full sentences with symmetric chest rise and fall. Pt ambulatory to triage.

## 2022-12-30 NOTE — ED Notes (Signed)
EKG complete.

## 2022-12-31 ENCOUNTER — Emergency Department
Admission: EM | Admit: 2022-12-31 | Discharge: 2022-12-31 | Disposition: A | Discharge status: Home / Self Care | Payer: BLUE CROSS/BLUE SHIELD | Attending: Emergency Medicine | Admitting: Emergency Medicine

## 2022-12-31 DIAGNOSIS — R079 Chest pain, unspecified: Secondary | ICD-10-CM

## 2022-12-31 LAB — HEPATIC FUNCTION PANEL
ALT: 17 U/L (ref 0–44)
AST: 19 U/L (ref 15–41)
Albumin: 4 g/dL (ref 3.5–5.0)
Alkaline Phosphatase: 53 U/L (ref 38–126)
Bilirubin, Direct: <0.1 mg/dL (ref 0.0–0.2)
Total Bilirubin: 0.8 mg/dL (ref 0.3–1.2)
Total Protein: 6.9 g/dL (ref 6.5–8.1)

## 2022-12-31 LAB — LIPASE, BLOOD: Lipase: 41 U/L (ref 11–51)

## 2022-12-31 LAB — RESP PANEL BY RT-PCR (RSV, FLU A&B, COVID)  RVPGX2
Influenza A by PCR: NEGATIVE
Influenza B by PCR: NEGATIVE
Resp Syncytial Virus by PCR: NEGATIVE
SARS Coronavirus 2 by RT PCR: NEGATIVE

## 2022-12-31 LAB — TSH: TSH: 2.84 u[IU]/mL (ref 0.350–4.500)

## 2022-12-31 LAB — MAGNESIUM: Magnesium: 2.4 mg/dL (ref 1.7–2.4)

## 2022-12-31 MED ORDER — POTASSIUM CHLORIDE CRYS ER 20 MEQ PO TBCR
40.0000 meq | EXTENDED_RELEASE_TABLET | Freq: Once | ORAL | Status: AC
  Administered 2022-12-31: 40 meq via ORAL
  Filled 2022-12-31: qty 2

## 2022-12-31 NOTE — Discharge Instructions (Signed)
Your labs, EKG, chest x-ray are reassuring today.  Your COVID, flu and RSV swabs were negative.  You may alternate Tylenol 1000 mg every 6 hours as needed for pain, fever and Ibuprofen 800 mg every 6-8 hours as needed for pain, fever.  Please take Ibuprofen with food.  Do not take more than 4000 mg of Tylenol (acetaminophen) in a 24 hour period.

## 2022-12-31 NOTE — ED Provider Notes (Signed)
[facility] Provider Note    Event Date/Time   First MD Initiated Contact with Patient 12/31/22 0045     (approximate)   History   Chest Pain   HPI  Melanie West is a 47 y.o. female with previous history of pericarditis currently wearing a loop recorder here from [place] due to her father's recent passing who presents with chest pain.  Feels like a pounding in her chest.  Also has had some cough and congestion.  No fevers, shortness of breath.  No history of PE, DVT, exogenous estrogen use, recent fractures, surgery, trauma, hospitalization, prolonged travel or other immobilization. No lower extremity swelling or pain. No calf tenderness.  Currently not having any discomfort.  States she called her cardiologist in [place] and they told her they did not see any events on her loop recorder.  History provided by patient.    Past Medical History:  Diagnosis Date   Pericarditis     Past Surgical History:  Procedure Laterality Date   CESAREAN SECTION     HERNIA REPAIR     WRIST SURGERY Right     MEDICATIONS:  Prior to Admission medications   Not on File    Physical Exam   Triage Vital Signs: ED Triage Vitals  Enc Vitals Group     BP 12/30/22 2003 123/73     Pulse Rate 12/30/22 2003 83     Resp 12/30/22 2002 19     Temp 12/30/22 2003 98.7 F (37.1 C)     Temp Source 12/30/22 2002 Oral     SpO2 12/30/22 2002 98 %     Weight 12/30/22 2003 161 lb (73 kg)     Height 12/30/22 2003 '5\' 5"'$  (1.651 m)     Head Circumference --      Peak Flow --      Pain Score 12/31/22 0022 2     Pain Loc --      Pain Edu? --      Excl. in [place]? --     Most recent vital signs: Vitals:   12/31/22 0022 12/31/22 0100  BP: 123/61 118/79  Pulse: 72 76  Resp: 20 14  Temp: 98 F (36.7 C)   SpO2: 100% 100%    CONSTITUTIONAL: Alert, responds appropriately to questions. Well-appearing; well-nourished HEAD: Normocephalic, atraumatic EYES: Conjunctivae clear, pupils  appear equal, sclera nonicteric ENT: normal nose; moist mucous membranes NECK: Supple, normal ROM CARD: RRR; S1 and S2 appreciated CHEST:  Chest wall is nontender to palpation.  No crepitus. RESP: Normal chest excursion without splinting or tachypnea; breath sounds clear and equal bilaterally; no wheezes, no rhonchi, no rales, no hypoxia or respiratory distress, speaking full sentences ABD/GI: Non-distended; soft, non-tender, no rebound, no guarding, no peritoneal signs BACK: The back appears normal EXT: Normal ROM in all joints; no deformity noted, no edema, no calf tenderness or calf swelling SKIN: Normal color for age and race; warm; no rash on exposed skin NEURO: Moves all extremities equally, normal speech PSYCH: The patient's mood and manner are appropriate.   ED Results / Procedures / Treatments   LABS: (all labs ordered are listed, but only abnormal results are displayed) Labs Reviewed  BASIC METABOLIC PANEL - Abnormal; Notable for the following components:      Result Value   Potassium 3.4 (*)    Glucose, Bld 135 (*)    All other components within normal limits  RESP PANEL BY RT-PCR (RSV, FLU A&B, COVID)  RVPGX2  CBC  MAGNESIUM  TSH  LIPASE, BLOOD  HEPATIC FUNCTION PANEL  TROPONIN I (HIGH SENSITIVITY)  TROPONIN I (HIGH SENSITIVITY)     EKG:  EKG Interpretation  Date/Time:  Wednesday December 30 2022 20:00:56 EST Ventricular Rate:  82 PR Interval:  144 QRS Duration: 64 QT Interval:  376 QTC Calculation: 439 R Axis:   76 Text Interpretation: Normal sinus rhythm Normal ECG When compared with ECG of 27-Jan-2017 03:26, No significant change was found Confirmed by Pryor Curia [phone]) on 12/31/2022 12:46:15 AM         RADIOLOGY: My personal review and interpretation of imaging: This x-ray clear.  I have personally reviewed all radiology reports.   DG Chest 2 View  Result Date: 12/30/2022 CLINICAL DATA:  Chest pain EXAM: CHEST - 2 VIEW COMPARISON:  01/27/2017  FINDINGS: Cardiac size is within normal limits. There are no signs of pulmonary edema or focal pulmonary consolidation. There is no pleural effusion or pneumothorax. There is implantable cardiac monitoring device in left anterior chest wall. There is surgical fusion in cervical spine. IMPRESSION: No active cardiopulmonary disease. Electronically Signed   By: Elmer Picker M.D.   On: 12/30/2022 20:13     PROCEDURES:  Critical Care performed: No      .1-3 Lead EKG Interpretation  Performed by: Livio Ledwith, Delice Bison, DO Authorized by: Cyril Woodmansee, Delice Bison, DO     Interpretation: normal     ECG rate:  76   ECG rate assessment: normal     Rhythm: sinus rhythm     Ectopy: none     Conduction: normal       IMPRESSION / MDM / ASSESSMENT AND PLAN / ED COURSE  I reviewed the triage vital signs and the nursing notes.    Patient here with intermittent chest pain that she thinks is likely from stress due to her father's recent passing.  The patient is on the cardiac monitor to evaluate for evidence of arrhythmia and/or significant heart rate changes.   DIFFERENTIAL DIAGNOSIS (includes but not limited to):   Stress, musculoskeletal pain, ACS, less likely PE as she is PERC negative, doubt dissection.  Pneumonia, viral URI on the differential given cough and congestion.   Patient's presentation is most consistent with acute presentation with potential threat to life or bodily function.   PLAN: Patient's labs show no leukocytosis, normal hemoglobin.  Potassium slightly low at 3.4.  Given oral replacement.  Normal magnesium, TSH.  Troponin x 2 negative.  Chest x-ray reviewed and interpreted by myself and radiologist and shows no acute abnormality.  Patient is currently asymptomatic.  Will obtain COVID, flu and RSV swabs.  Will monitor on cardiac monitoring.   MEDICATIONS GIVEN IN ED: Medications  potassium chloride SA (KLOR-CON M) CR tablet 40 mEq (40 mEq Oral Given 12/31/22 0057)     ED  COURSE: Respiratory viral swab negative.  No evidence seen on cardiac monitoring.  I feel she is safe for discharge.  Patient comfortable with this plan.   At this time, I do not feel there is any life-threatening condition present. I reviewed all nursing notes, vitals, pertinent previous records.  All lab and urine results, EKGs, imaging ordered have been independently reviewed and interpreted by myself.  I reviewed all available radiology reports from any imaging ordered this visit.  Based on my assessment, I feel the patient is safe to be discharged home without further emergent workup and can continue workup as an outpatient as needed. Discussed all  findings, treatment plan as well as usual and customary return precautions.  They verbalize understanding and are comfortable with this plan.  Outpatient follow-up has been provided as needed.  All questions have been answered.    CONSULTS:  none   OUTSIDE RECORDS REVIEWED: Reviewed last family medicine visit in [place] on 11/24/2022.       FINAL CLINICAL IMPRESSION(S) / ED DIAGNOSES   Final diagnoses:  Nonspecific chest pain     Rx / DC Orders   ED Discharge Orders     None        Note:  This document was prepared using Dragon voice recognition software and may include unintentional dictation errors.   Loana Salvaggio, Delice Bison, DO 12/31/22 [phone]

## 2023-01-01 ENCOUNTER — Emergency Department
Admit: 2023-01-01 | Discharge: 2023-01-02 | Disposition: A | Discharge status: Home / Self Care | Payer: PRIVATE HEALTH INSURANCE

## 2023-01-01 ENCOUNTER — Ambulatory Visit
Admit: 2023-01-01 | Discharge: 2023-01-02 | Disposition: A | Discharge status: Home / Self Care | Payer: PRIVATE HEALTH INSURANCE

## 2023-01-01 DIAGNOSIS — R002 Palpitations: Principal | ICD-10-CM

## 2023-01-01 DIAGNOSIS — R0602 Shortness of breath: Principal | ICD-10-CM

## 2023-09-03 IMAGING — US US EXTREM LOW VENOUS*L*
1 series · 14 of 24 positions shown · non-contrast
Comparison: None Available.

CLINICAL DATA: Left lower leg pain

EXAM:
Left LOWER EXTREMITY VENOUS DOPPLER ULTRASOUND
TECHNIQUE: Gray-scale sonography with compression, as well as color and duplex
ultrasound, were performed to evaluate the deep venous system(s)
from the level of the common femoral vein through the popliteal and
proximal calf veins.

[Series 1: us venous img lower uni left (dvt) · portal-venous · 14 of 34 slices shown]
[im 1/34]
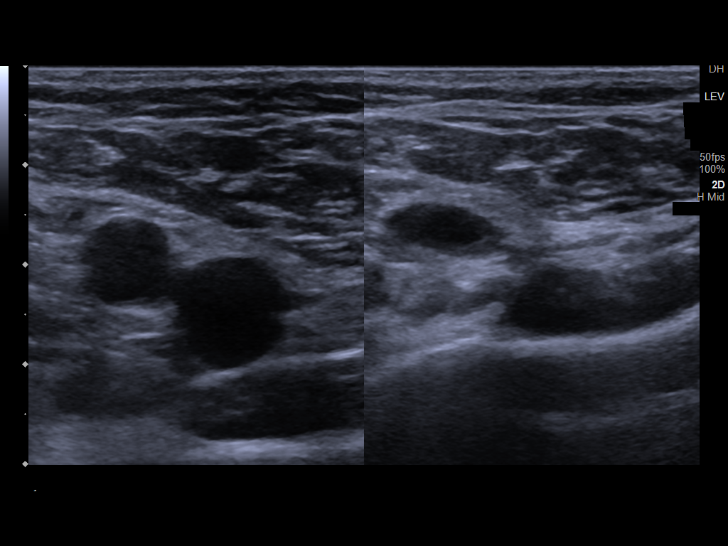
[im 3/34]
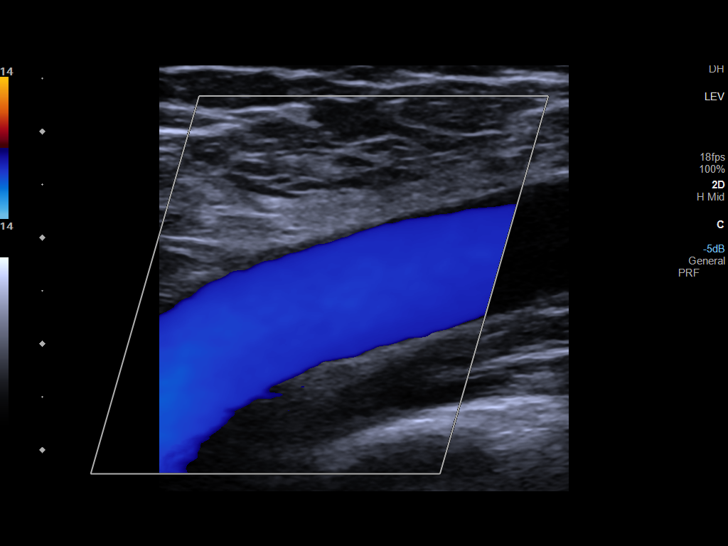
[im 6/34]
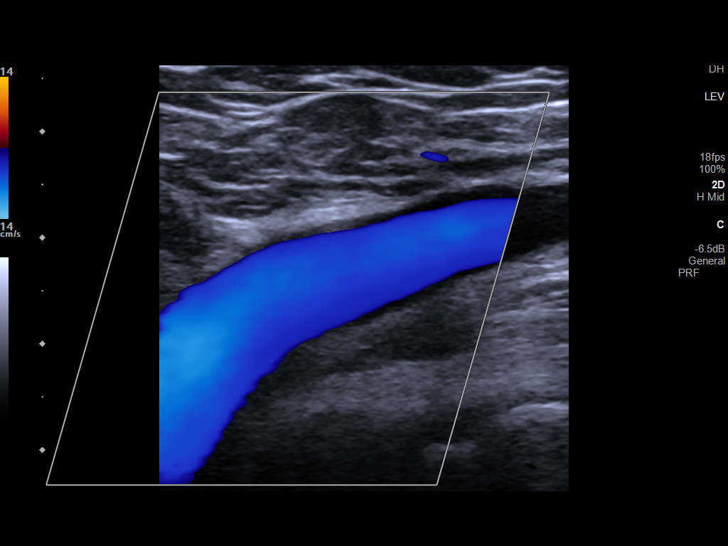
[im 9/34]
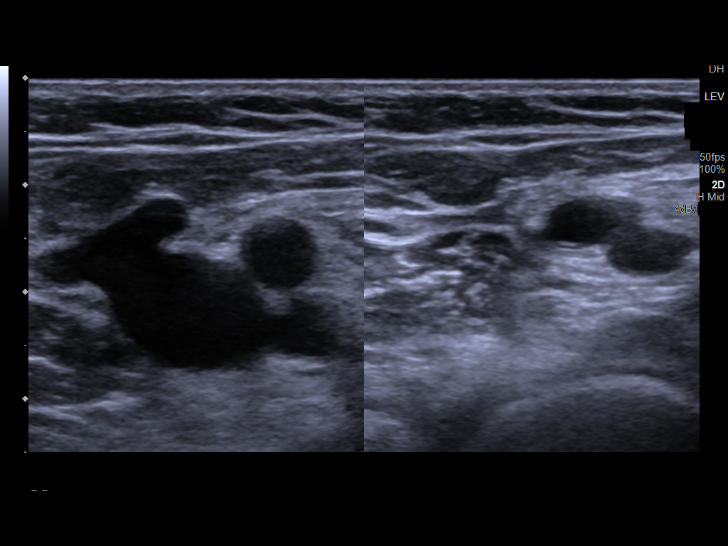
[im 11/34]
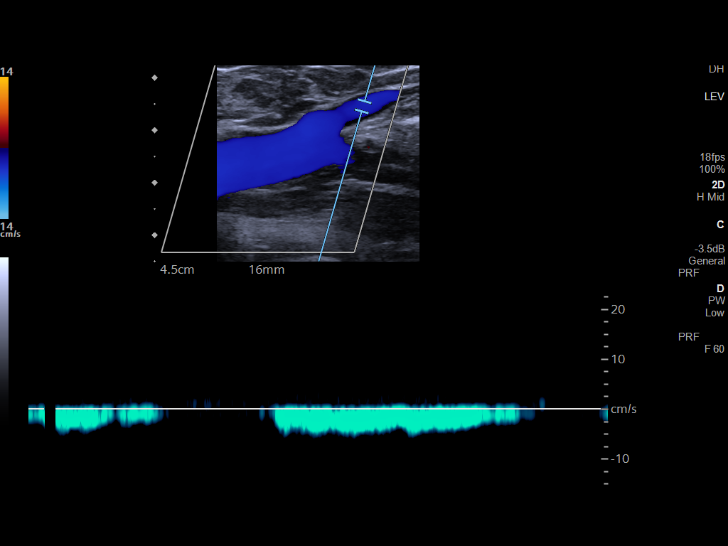
[im 13/34]
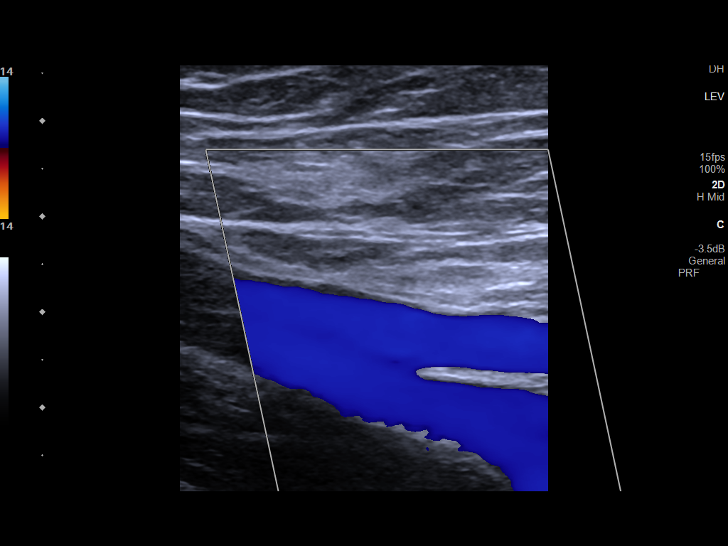
[im 16/34]
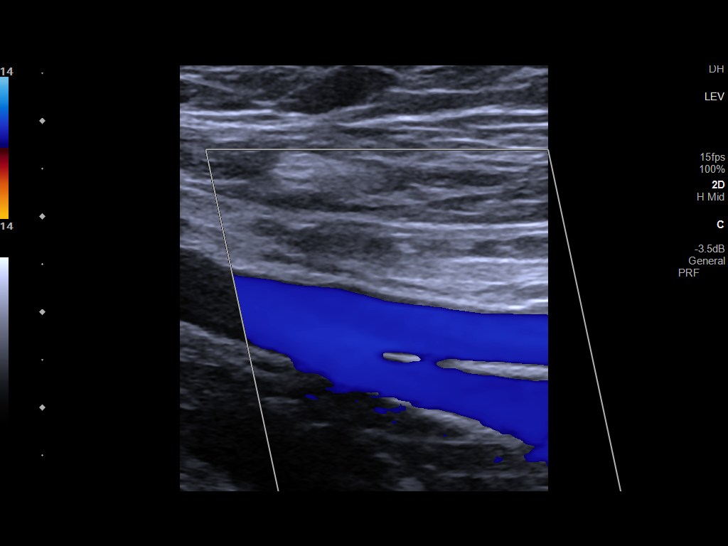
[im 18/34]
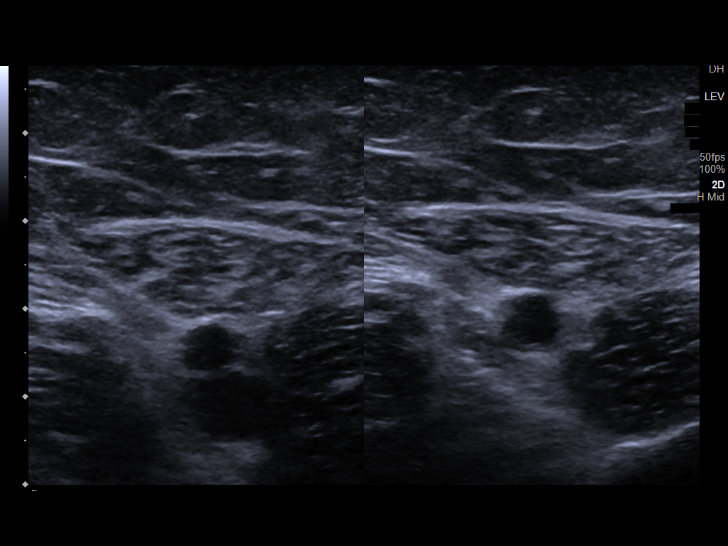
[im 21/34]
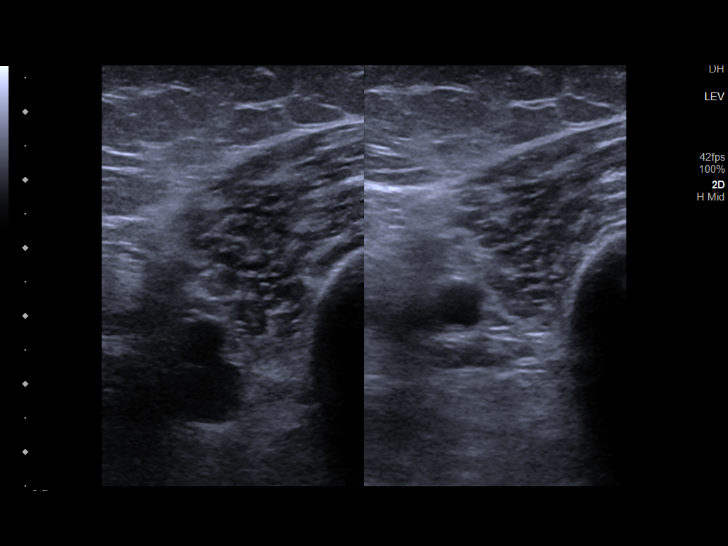
[im 23/34]
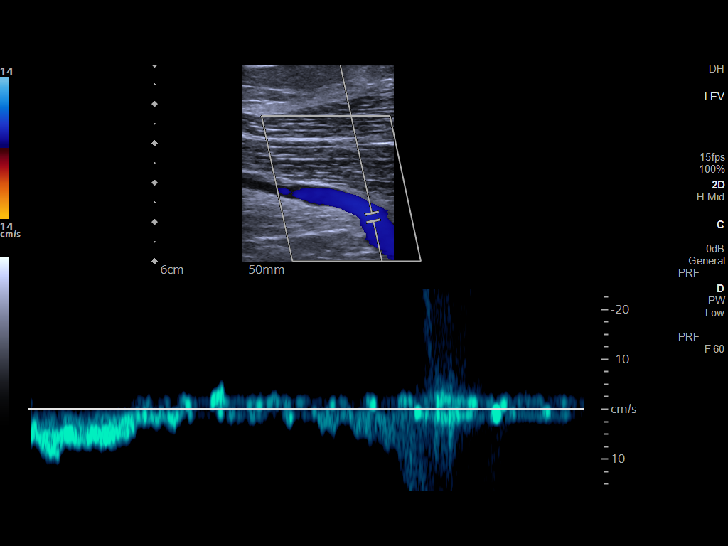
[im 26/34]
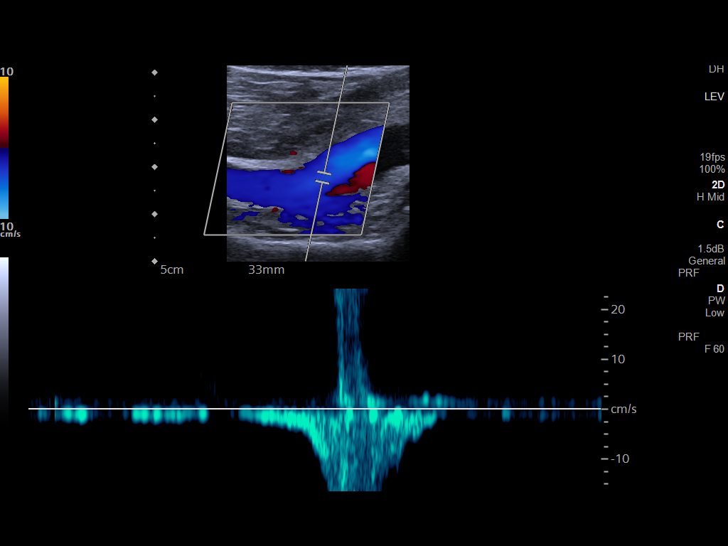
[im 28/34]
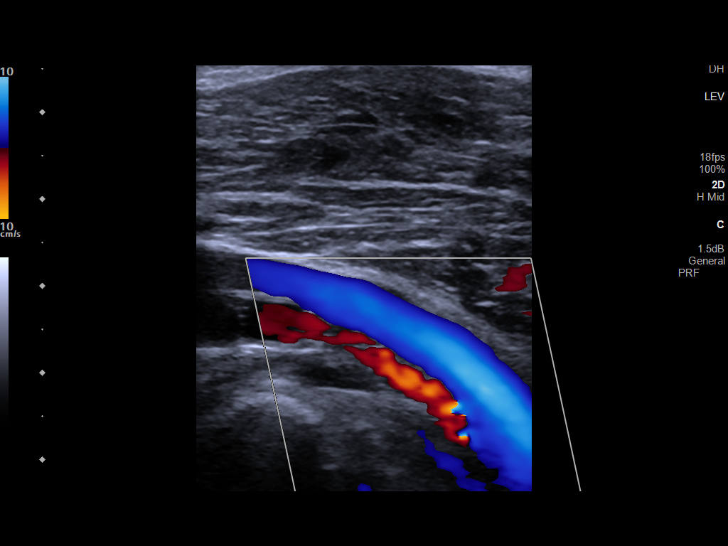
[im 31/34]
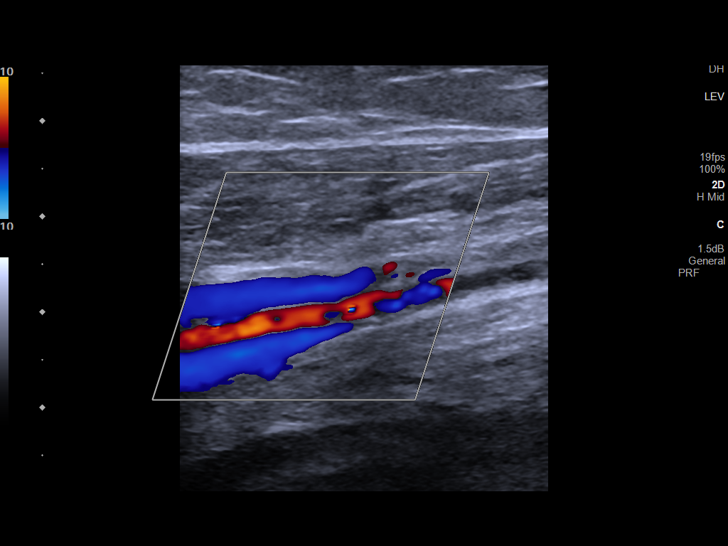
[im 34/34]
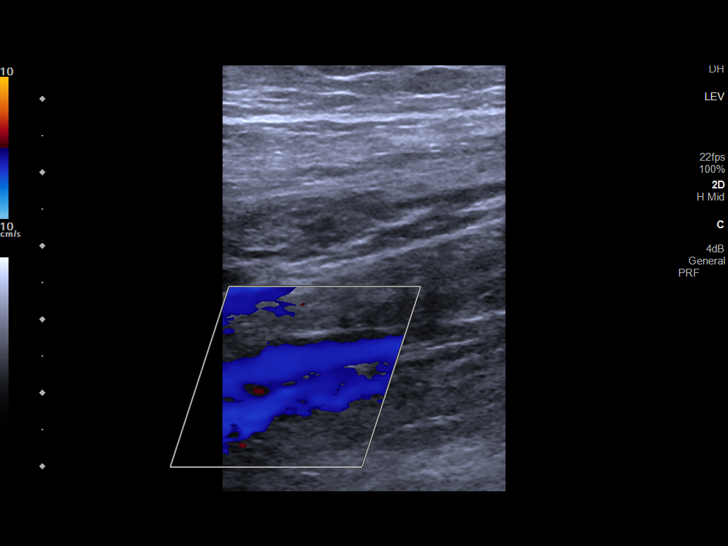

[14 of 24 positions shown; findings below may reference images not displayed]

FINDINGS: VENOUS

Normal compressibility of the common femoral, superficial femoral,
and popliteal veins, as well as the visualized calf veins.
Visualized portions of profunda femoral vein and great saphenous
vein unremarkable. No filling defects to suggest DVT on grayscale or
color Doppler imaging. Doppler waveforms show normal direction of
venous flow, normal respiratory plasticity and response to
augmentation.

Limited views of the contralateral common femoral vein are
unremarkable.

OTHER

None.

Limitations: none
IMPRESSION: Negative.
